# Patient Record
Sex: Male | Born: 1970
Health system: Southern US, Community
[De-identification: ages and names within clinical notes are randomized; demographics above are authoritative.]

## PROBLEM LIST (undated history)

## (undated) DIAGNOSIS — M109 Gout, unspecified: Secondary | ICD-10-CM

## (undated) DIAGNOSIS — I82409 Acute embolism and thrombosis of unspecified deep veins of unspecified lower extremity: Secondary | ICD-10-CM

## (undated) HISTORY — DX: Gout, unspecified: M10.9

## (undated) HISTORY — PX: CHOLECYSTECTOMY: SHX55

---

## 2019-07-03 ENCOUNTER — Encounter: Payer: Self-pay | Admitting: Podiatry

## 2019-07-03 ENCOUNTER — Ambulatory Visit (INDEPENDENT_AMBULATORY_CARE_PROVIDER_SITE_OTHER): Payer: Managed Care, Other (non HMO) | Admitting: Podiatry

## 2019-07-03 ENCOUNTER — Other Ambulatory Visit: Payer: Self-pay

## 2019-07-03 DIAGNOSIS — B070 Plantar wart: Secondary | ICD-10-CM

## 2019-07-07 NOTE — Progress Notes (Signed)
   Subjective: 48 y.o. male presenting today with a chief complaint of a painful lesion on the plantar aspect of the right foot that appeared about two months ago. He states it feels like he is walking on a rock. Wearing shoes increases the pain. He has been using corn pads with no significant relief. Patient is here for further evaluation and treatment.    No past medical history on file.  Objective: Physical Exam General: The patient is alert and oriented x3 in no acute distress.   Dermatology: Hyperkeratotic skin lesion(s) noted to the plantar aspect of the right foot approximately 1 cm in diameter. Pinpoint bleeding noted upon debridement. Skin is warm, dry and supple bilateral lower extremities. Negative for open lesions or macerations.   Vascular: Palpable pedal pulses bilaterally. No edema or erythema noted. Capillary refill within normal limits.   Neurological: Epicritic and protective threshold grossly intact bilaterally.    Musculoskeletal Exam: Pain on palpation to the noted skin lesion(s).  Range of motion within normal limits to all pedal and ankle joints bilateral. Muscle strength 5/5 in all groups bilateral.    Assessment: #1 plantar wart right foot #2 pain in right foot     Plan of Care:  #1 Patient was evaluated. #2 Excisional debridement of the plantar wart lesion(s) was performed using a chisel blade. Cantharone was applied and the lesion(s) was dressed with a dry sterile dressing. #3 patient is to return to clinic in 2 weeks.     Edrick Kins, DPM [facility]  Dr. Edrick Kins, [address]                                        [place], [place] [address]                Office [phone]  Fax [phone]

## 2019-07-21 ENCOUNTER — Encounter: Payer: Self-pay | Admitting: Podiatry

## 2019-07-21 ENCOUNTER — Other Ambulatory Visit: Payer: Self-pay

## 2019-07-21 ENCOUNTER — Ambulatory Visit (INDEPENDENT_AMBULATORY_CARE_PROVIDER_SITE_OTHER): Payer: Managed Care, Other (non HMO) | Admitting: Podiatry

## 2019-07-21 DIAGNOSIS — B070 Plantar wart: Secondary | ICD-10-CM

## 2019-07-27 NOTE — Progress Notes (Signed)
   Subjective: 48 y.o. male presenting today for follow up evaluation of a plantar wart of the right foot. He states the area is improving. He states the pain is less severe with walking and weightbearing. He has not done anything at home for treatment. Patient is here for further evaluation and treatment.    No past medical history on file.  Objective: Physical Exam General: The patient is alert and oriented x3 in no acute distress.   Dermatology: Hyperkeratotic skin lesion(s) noted to the plantar aspect of the right foot approximately 1 cm in diameter. Pinpoint bleeding noted upon debridement. Skin is warm, dry and supple bilateral lower extremities. Negative for open lesions or macerations.   Vascular: Palpable pedal pulses bilaterally. No edema or erythema noted. Capillary refill within normal limits.   Neurological: Epicritic and protective threshold grossly intact bilaterally.    Musculoskeletal Exam: Pain on palpation to the noted skin lesion(s).  Range of motion within normal limits to all pedal and ankle joints bilateral. Muscle strength 5/5 in all groups bilateral.    Assessment: #1 plantar wart right foot     Plan of Care:  #1 Patient was evaluated. #2 Excisional debridement of the plantar wart lesion(s) was performed using a chisel blade. Dry sterile dressing placed.  #3 Recommended OTC corn and callus remover for two weeks.  #4 patient is to return to clinic as needed.     Edrick Kins, DPM [facility]  Dr. Edrick Kins, [address]                                        [place], [place] [address]                Office [phone]  Fax [phone]

## 2020-10-04 ENCOUNTER — Ambulatory Visit: Payer: Self-pay

## 2021-04-14 LAB — HM COLONOSCOPY

## 2021-06-02 ENCOUNTER — Ambulatory Visit: Payer: Self-pay

## 2021-09-21 ENCOUNTER — Ambulatory Visit: Payer: Self-pay

## 2021-09-22 ENCOUNTER — Other Ambulatory Visit: Payer: Self-pay

## 2021-09-22 ENCOUNTER — Encounter: Payer: Self-pay | Admitting: Podiatry

## 2021-09-22 ENCOUNTER — Ambulatory Visit (INDEPENDENT_AMBULATORY_CARE_PROVIDER_SITE_OTHER): Payer: Managed Care, Other (non HMO) | Admitting: Podiatry

## 2021-09-22 ENCOUNTER — Ambulatory Visit (INDEPENDENT_AMBULATORY_CARE_PROVIDER_SITE_OTHER): Payer: Managed Care, Other (non HMO)

## 2021-09-22 DIAGNOSIS — M7661 Achilles tendinitis, right leg: Secondary | ICD-10-CM | POA: Diagnosis not present

## 2021-09-22 DIAGNOSIS — B351 Tinea unguium: Secondary | ICD-10-CM | POA: Diagnosis not present

## 2021-09-22 DIAGNOSIS — M779 Enthesopathy, unspecified: Secondary | ICD-10-CM

## 2021-09-22 MED ORDER — METHYLPREDNISOLONE 4 MG PO TBPK: ORAL_TABLET | ORAL | 0 refills | Status: DC

## 2021-09-22 MED ORDER — MELOXICAM 15 MG PO TABS: 15.0000 mg | ORAL_TABLET | Freq: Every day | ORAL | 1 refills | Status: DC

## 2021-09-22 MED ORDER — TERBINAFINE HCL 250 MG PO TABS: 250.0000 mg | ORAL_TABLET | Freq: Every day | ORAL | 0 refills | Status: DC

## 2021-09-23 LAB — HEPATIC FUNCTION PANEL
ALT: 27 IU/L (ref 0–44)
AST: 21 IU/L (ref 0–40)
Albumin: 4.5 g/dL (ref 4.0–5.0)
Alkaline Phosphatase: 79 IU/L (ref 44–121)
Bilirubin Total: 0.3 mg/dL (ref 0.0–1.2)
Bilirubin, Direct: 0.14 mg/dL (ref 0.00–0.40)
Total Protein: 6.8 g/dL (ref 6.0–8.5)

## 2021-09-29 NOTE — Progress Notes (Signed)
° °  HPI: 51 y.o. male presenting today for new complaints today regarding posterior ankle pain to the right lower extremity.  This has been ongoing for several days.  He denies a history of injury.  He experiences shooting pain.  Aggravated by going barefoot.  He has been wearing a brace which is helped.  Patient also would like to have his right great toenail evaluated.  He says that it is very discolored and thick for the past 6 months.  Sudden onset.  He has not done anything for treatment other than OTC creams which have not helped alleviate any of his symptoms for the toenail  No past medical history on file. No Known Allergies   Physical Exam: General: The patient is alert and oriented x3 in no acute distress.  Dermatology: Skin is warm, dry and supple bilateral lower extremities. Negative for open lesions or macerations.  Hyperkeratotic dystrophic discolored toenail noted to the right hallux nail plate  Vascular: Palpable pedal pulses bilaterally. Capillary refill within normal limits.  Negative for any significant edema or erythema  Neurological: Light touch and protective threshold grossly intact  Musculoskeletal Exam: No pedal deformities noted.  There is some pain on palpation to the midportion of the Achilles tendon about 5 cm proximal to the insertion of the calcaneus  Radiographic Exam:  Normal osseous mineralization. Joint spaces preserved. No fracture/dislocation/boney destruction.    Assessment: 1.  Mid substance Achilles tendinitis right 2.  Onychomycosis of toenail right hallux   Plan of Care:  1. Patient evaluated. X-Rays reviewed.  2.  Prescription for Medrol Dosepak 3.  Prescription for meloxicam 15 mg daily after completion of the Dosepak 4.  Continue ankle brace 5.  Recommend daily stretching exercises and advised against going barefoot.  Recommend good supportive shoes and sneakers that do not irritate the posterior heel or Achilles 6.  Today we discussed  different treatment options regarding onychomycosis and fungal nail infections.  Discussed both oral, topical, and laser antifungal treatment modalities.  Patient opts for oral antifungal medication.  He denies a history of liver pathology or symptoms 7.  Order placed for hepatic function panel.  Discontinue if abnormal 8.  Prescription for Lamisil to 50 mg #90 daily 9.  Return to clinic in 3 weeks  *Works at [employer], DPM [facility]  Dr. Edrick Kins, DPM    [address], [place] [address]                Office [phone]  Fax [phone]

## 2021-11-23 ENCOUNTER — Encounter: Payer: Self-pay | Admitting: Emergency Medicine

## 2021-11-23 ENCOUNTER — Emergency Department
Admission: EM | Admit: 2021-11-23 | Discharge: 2021-11-23 | Disposition: A | Discharge status: Home / Self Care | Payer: Managed Care, Other (non HMO) | Attending: Emergency Medicine | Admitting: Emergency Medicine

## 2021-11-23 ENCOUNTER — Emergency Department: Payer: Managed Care, Other (non HMO)

## 2021-11-23 ENCOUNTER — Ambulatory Visit
Admission: RE | Admit: 2021-11-23 | Discharge: 2021-11-23 | Disposition: A | Discharge status: Other Acute Inpatient Hospital | Payer: Managed Care, Other (non HMO) | Source: Ambulatory Visit | Attending: Emergency Medicine | Admitting: Emergency Medicine

## 2021-11-23 ENCOUNTER — Other Ambulatory Visit: Payer: Self-pay

## 2021-11-23 VITALS — BP 117/82 | HR 82 | Temp 98.6°F | Resp 20

## 2021-11-23 DIAGNOSIS — R9431 Abnormal electrocardiogram [ECG] [EKG]: Secondary | ICD-10-CM | POA: Diagnosis not present

## 2021-11-23 DIAGNOSIS — R079 Chest pain, unspecified: Secondary | ICD-10-CM | POA: Diagnosis present

## 2021-11-23 DIAGNOSIS — R7989 Other specified abnormal findings of blood chemistry: Secondary | ICD-10-CM | POA: Insufficient documentation

## 2021-11-23 HISTORY — DX: Acute embolism and thrombosis of unspecified deep veins of unspecified lower extremity: I82.409

## 2021-11-23 LAB — CBC
HCT: 46.1 % (ref 39.0–52.0)
Hemoglobin: 14.9 g/dL (ref 13.0–17.0)
MCH: 29.7 pg (ref 26.0–34.0)
MCHC: 32.3 g/dL (ref 30.0–36.0)
MCV: 92 fL (ref 80.0–100.0)
Platelets: 230 10*3/uL (ref 150–400)
RBC: 5.01 MIL/uL (ref 4.22–5.81)
RDW: 12.7 % (ref 11.5–15.5)
WBC: 6 10*3/uL (ref 4.0–10.5)
nRBC: 0 % (ref 0.0–0.2)

## 2021-11-23 LAB — BASIC METABOLIC PANEL
Anion gap: 6 (ref 5–15)
BUN: 17 mg/dL (ref 6–20)
CO2: 31 mmol/L (ref 22–32)
Calcium: 9.6 mg/dL (ref 8.9–10.3)
Chloride: 103 mmol/L (ref 98–111)
Creatinine, Ser: 1.36 mg/dL — ABNORMAL HIGH (ref 0.61–1.24)
GFR, Estimated: >60 mL/min (ref >60)
Glucose, Bld: 85 mg/dL (ref 70–99)
Potassium: 4.7 mmol/L (ref 3.5–5.1)
Sodium: 140 mmol/L (ref 135–145)

## 2021-11-23 LAB — TROPONIN I (HIGH SENSITIVITY)
Troponin I (High Sensitivity): 4 ng/L (ref <18)
Troponin I (High Sensitivity): 4 ng/L (ref <18)

## 2021-11-23 LAB — D-DIMER, QUANTITATIVE: D-Dimer, Quant: 0.3 ug/mL-FEU (ref 0.00–0.50)

## 2021-11-23 MED ORDER — LIDOCAINE VISCOUS HCL 2 % MT SOLN
15.0000 mL | Freq: Once | OROMUCOSAL | Status: AC
Start: 2021-11-23 — End: 2021-11-23
  Administered 2021-11-23: 15 mL via ORAL
  Filled 2021-11-23: qty 15

## 2021-11-23 MED ORDER — ALUM & MAG HYDROXIDE-SIMETH 200-200-20 MG/5ML PO SUSP
30.0000 mL | Freq: Once | ORAL | Status: AC
Start: 2021-11-23 — End: 2021-11-23
  Administered 2021-11-23: 30 mL via ORAL
  Filled 2021-11-23: qty 30

## 2021-11-23 MED ORDER — ACETAMINOPHEN 500 MG PO TABS
1000.0000 mg | ORAL_TABLET | Freq: Once | ORAL | Status: AC
  Administered 2021-11-23: 1000 mg via ORAL
  Filled 2021-11-23: qty 2

## 2021-11-23 NOTE — ED Provider Notes (Signed)
?[facility] ? ? ? ?CSN: [id number] ?Arrival date & time: 11/23/21  1305 ? ? ?  ? ?History   ?Chief Complaint ?Chief Complaint  ?Patient presents with  ? Chest Pain  ? ? ?HPI ?Roy Lewis is a 51 y.o. male.  Patient presents with midsternal and left side chest pain for approximately 48 hours which became acutely worse at 0400 this morning.  The pain woke him up this morning.  He took a Tums without relief.  He describes the pain as "tightness" and feeling like someone hit him in the chest.  No identified aggravating or alleviating factors.  The pain is constant, 5/10, nonradiating.  No associated symptoms; he denies numbness, weakness, shortness of breath, nausea, vomiting, diaphoresis, headache, dizziness, or other symptoms.  He denies history of cardiac disease. ?The history is provided by the patient.  ? ?History reviewed. No pertinent past medical history. ? ?There are no problems to display for this patient. ? ? ?History reviewed. No pertinent surgical history. ? ? ? ? ?Home Medications   ? ?Prior to Admission medications   ?Medication Sig Start Date End Date Taking? Authorizing Provider  ?meloxicam (MOBIC) 15 MG tablet Take 1 tablet (15 mg total) by mouth daily. 09/22/21   Felecia Shelling, DPM  ?methylPREDNISolone (MEDROL DOSEPAK) 4 MG TBPK tablet 6 day dose pack - take as directed 09/22/21   Felecia Shelling, DPM  ?terbinafine (LAMISIL) 250 MG tablet Take 1 tablet (250 mg total) by mouth daily. 09/22/21   Felecia Shelling, DPM  ? ? ?Family History ?History reviewed. No pertinent family history. ? ?Social History ?Social History  ? ?Tobacco Use  ? Smoking status: Never  ? Smokeless tobacco: Never  ?Substance Use Topics  ? Alcohol use: Yes  ?  Alcohol/week: 2.0 standard drinks  ?  Types: 2 Cans of beer per week  ?  Comment: 2/week  ? Drug use: Never  ? ? ? ?Allergies   ?Patient has no known allergies. ? ? ?Review of Systems ?Review of Systems  ?Respiratory:  Negative for cough and shortness of breath.    ?Cardiovascular:  Positive for chest pain. Negative for palpitations.  ?Gastrointestinal:  Negative for nausea and vomiting.  ?Neurological:  Negative for dizziness, facial asymmetry, speech difficulty, weakness, numbness and headaches.  ?All other systems reviewed and are negative. ? ? ?Physical Exam ?Triage Vital Signs ?ED Triage Vitals [11/23/21 1308]  ?Enc Vitals Group  ?   BP   ?   Pulse   ?   Resp   ?   Temp   ?   Temp src   ?   SpO2   ?   Weight   ?   Height   ?   Head Circumference   ?   Peak Flow   ?   Pain Score 5  ?   Pain Loc   ?   Pain Edu?   ?   Excl. in GC?   ? ?No data found. ? ?Updated Vital Signs ?BP 117/82   Pulse 82   Temp 98.6 ?F (37 ?C)   Resp 20   SpO2 96%  ? ?Visual Acuity ?Right Eye Distance:   ?Left Eye Distance:   ?Bilateral Distance:   ? ?Right Eye Near:   ?Left Eye Near:    ?Bilateral Near:    ? ?Physical Exam ?Vitals and nursing note reviewed.  ?Constitutional:   ?   General: He is not in acute distress. ?  Appearance: He is obese.  ?HENT:  ?   Mouth/Throat:  ?   Mouth: Mucous membranes are moist.  ?Cardiovascular:  ?   Rate and Rhythm: Normal rate and regular rhythm.  ?   Heart sounds: Normal heart sounds.  ?Pulmonary:  ?   Effort: Pulmonary effort is normal. No respiratory distress.  ?   Breath sounds: Normal breath sounds.  ?Abdominal:  ?   Palpations: Abdomen is soft.  ?   Tenderness: There is no abdominal tenderness.  ?Musculoskeletal:  ?   Cervical back: Neck supple.  ?   Right lower leg: No edema.  ?   Left lower leg: No edema.  ?Skin: ?   General: Skin is warm and dry.  ?Neurological:  ?   General: No focal deficit present.  ?   Mental Status: He is alert and oriented to person, place, and time.  ?   Sensory: No sensory deficit.  ?   Motor: No weakness.  ?   Gait: Gait normal.  ?Psychiatric:     ?   Mood and Affect: Mood normal.     ?   Behavior: Behavior normal.  ? ? ? ?UC Treatments / Results  ?Labs ?(all labs ordered are listed, but only abnormal results are  displayed) ?Labs Reviewed - No data to display ? ?EKG ? ? ?Radiology ?No results found. ? ?Procedures ?Procedures (including critical care time) ? ?Medications Ordered in UC ?Medications - No data to display ? ?Initial Impression / Assessment and Plan / UC Course  ?I have reviewed the triage vital signs and the nursing notes. ? ?Pertinent labs & imaging results that were available during my care of the patient were reviewed by me and considered in my medical decision making (see chart for details). ? ?Chest pain, abnormal EKG.  EKG shows sinus rhythm with PVCs, rate 65, no ST elevation, no previous to compare.  Discussed limitations of evaluation of chest pain in an urgent care setting.  Sending patient to the ED for evaluation.  He declines transport via EMS.  He feels he is stable to drive himself to the ED. ? ? ?Final Clinical Impressions(s) / UC Diagnoses  ? ?Final diagnoses:  ?Chest pain, unspecified type  ?Abnormal EKG  ? ? ? ?Discharge Instructions   ? ?  ?Go to the emergency department for evaluation of your chest pain and abnormal EKG. ? ? ? ? ?ED Prescriptions   ?None ?  ? ?PDMP not reviewed this encounter. ?  ?Mickie Bail, NP ?11/23/21 1335 ? ?

## 2021-11-23 NOTE — ED Triage Notes (Signed)
Pt comes into the ED via POV c/o left side chest pain that started 2 days ago.  Pt states it got worse last night to the point where it woke him up in his sleep.  Pt denies any cardiac history.  Pt went to UC and they found his EKG to be abnormal and sent him here.  Pt denies any radiating pain, but admits to some [facility] with the pain.  ?

## 2021-11-23 NOTE — Discharge Instructions (Addendum)
Follow-up with cardiology tomorrow at 9 AM and return to the ER if develop worsening pain or any other issues.  You can take 1 g every 8 hours to help with any pain. ? ? ? ? ?

## 2021-11-23 NOTE — Discharge Instructions (Addendum)
Go to the emergency department for evaluation of your chest pain and abnormal EKG.  ? ? ?

## 2021-11-23 NOTE — ED Provider Notes (Signed)
? ?[facility] ?Provider Note ? ? ? Event Date/Time  ? First MD Initiated Contact with Patient 11/23/21 1532   ?  (approximate) ? ? ?History  ? ?Chest Pain ? ? ?HPI ? ?Roy Lewis is a 51 y.o. male who comes in from urgent care for chest pain.  Patient had left-sided chest pain for approximately 48 hours which came acutely worse at 4 AM this morning he took Tums without any relief.  Patient went over to urgent care and was noted to have an EKG with PVCs and was told to go to the ER to be evaluated.  Patient denies any prior cardiac history.  He denies smoking.  He denies been on any medications currently.  He states that the only thing that he has a history of is a blood clot in his right leg that was 4 years ago in the setting of a long flight.  He denies any significant shortness of breath.  He reports that the pain is just about 2 inches on his left chest wall but is like a pressure sensation.  He reports it is currently very minimal.  He reports that he was not sure if it could just be his acid reflux given he does have a history of that.  But he does not feel like it is a burning sensation.  He denies any chest swelling, abdominal pain or other concerns.  He denies any exertional component to his chest pain. ? ?I reviewed patient's podiatry visit from 08/2021 ? ? ? ?Physical Exam  ? ?Triage Vital Signs: ?ED Triage Vitals  ?Enc Vitals Group  ?   BP 11/23/21 1357 115/79  ?   Pulse Rate 11/23/21 1357 81  ?   Resp 11/23/21 1357 16  ?   Temp 11/23/21 1357 97.8 ?F (36.6 ?C)  ?   Temp Source 11/23/21 1357 Oral  ?   SpO2 11/23/21 1357 92 %  ?   Weight 11/23/21 1343 262 lb (118.8 kg)  ?   Height 11/23/21 1343 5\' 10"  (1.778 m)  ?   Head Circumference --   ?   Peak Flow --   ?   Pain Score 11/23/21 1343 7  ?   Pain Loc --   ?   Pain Edu? --   ?   Excl. in GC? --   ? ? ?Most recent vital signs: ?Vitals:  ? 11/23/21 1357  ?BP: 115/79  ?Pulse: 81  ?Resp: 16  ?Temp: 97.8 ?F (36.6 ?C)  ?SpO2: 92%   ? ? ? ?General: Awake, no distress.  ?CV:  Good peripheral perfusion.  No chest wall tenderness.  No rash noted ?Resp:  Normal effort.  ?Abd:  No distention.  Soft and nontender ?Other:  No leg swelling.  No calf tenderness ? ? ?ED Results / Procedures / Treatments  ? ?Labs ?(all labs ordered are listed, but only abnormal results are displayed) ?Labs Reviewed  ?BASIC METABOLIC PANEL - Abnormal; Notable for the following components:  ?    Result Value  ? Creatinine, Ser 1.36 (*)   ? All other components within normal limits  ?CBC  ?TROPONIN I (HIGH SENSITIVITY)  ?TROPONIN I (HIGH SENSITIVITY)  ? ? ? ?EKG ? ?My interpretation of EKG: ? ?Normal sinus rate of 68 without any ST elevation, T wave inversion in lead II with an occasional PVC, normal intervals ? ?Repeat EKG is normal sinus rate of 65 with a PVC, no ST elevation, T wave inversion in  V2 and normal intervals ? ?Repeat EKG is normal sinus rate of 83 without any ST elevation, T wave inversion in V2, normal intervals ? ?RADIOLOGY ?I have reviewed the xray personally and no evidence of pneumonia, pleural effusions ? ? ?PROCEDURES: ? ?Critical Care performed: No ? ?Procedures ? ? ?MEDICATIONS ORDERED IN ED: ?Medications  ?acetaminophen (TYLENOL) tablet 1,000 mg (1,000 mg Oral Given 11/23/21 1615)  ?alum & mag hydroxide-simeth (MAALOX/MYLANTA) 200-200-20 MG/5ML suspension 30 mL (30 mLs Oral Given 11/23/21 1615)  ?  And  ?lidocaine (XYLOCAINE) 2 % viscous mouth solution 15 mL (15 mLs Oral Given 11/23/21 1615)  ? ? ? ?IMPRESSION / MDM / ASSESSMENT AND PLAN / ED COURSE  ?I reviewed the triage vital signs and the nursing notes. ? ?Patient comes in with new chest pain.  Chest pain sounds of kind of atypical but will get EKG, cardiac markers to further evaluate.  No abdominal pain/gallbladder pathology and he reports already having it removed.  Given patient's history of blood clot will add on D-dimer to evaluate for PE.  His original oxygen was charted at 92% but in urgent  care it was 95% when I hooked him up it was 98% so not sure if this was just an air.  We will keep a close eye on this as well.  He denies any significant shortness of breath. ? ?BMP shows creatinine elevated at 1.36. ?CBC shows no anemia.  No white count elevation ?Troponin is negative ? ?Score was 1.5 due to prior history of DVT therefore proceeded with D-dimer which was negative therefore low suspicion for PE. ? ?On repeat evaluation patient reports near resolution of symptoms.  D-dimer is negative, troponin is negative ? ?I considered admission for patient but given patient's low heart score and cardiac markers negative x2 I think it is reasonable to follow-up outpatient with cardiology ? ?Discussed with cardiology to get close follow-up and he will be seen tomorrow at 9 AM.  Patient feels comfortable going home at this time and understands that if symptoms are worsening to return to the ER immediately because I cannot protect the future heart attacks.  He expressed understanding and felt comfortable with home ? ?  ? ? ?FINAL CLINICAL IMPRESSION(S) / ED DIAGNOSES  ? ?Final diagnoses:  ?Chest pain, unspecified type  ? ? ? ?Rx / DC Orders  ? ?ED Discharge Orders   ? ? None  ? ?  ? ? ? ?Note:  This document was prepared using Dragon voice recognition software and may include unintentional dictation errors. ?  ?Concha Se, MD ?11/23/21 1720 ? ?

## 2021-11-23 NOTE — ED Triage Notes (Signed)
Pt here with left sided chest tightness since yesterday with an increase in discomfort since 4am this morning.  ?

## 2022-07-19 ENCOUNTER — Ambulatory Visit (INDEPENDENT_AMBULATORY_CARE_PROVIDER_SITE_OTHER): Payer: Managed Care, Other (non HMO)

## 2022-07-19 ENCOUNTER — Ambulatory Visit
Admission: EM | Admit: 2022-07-19 | Discharge: 2022-07-19 | Disposition: A | Discharge status: Home / Self Care | Payer: Managed Care, Other (non HMO) | Attending: Emergency Medicine | Admitting: Emergency Medicine

## 2022-07-19 DIAGNOSIS — M25572 Pain in left ankle and joints of left foot: Secondary | ICD-10-CM | POA: Diagnosis not present

## 2022-07-19 MED ORDER — PREDNISONE 10 MG (21) PO TBPK: ORAL_TABLET | Freq: Every day | ORAL | 0 refills | Status: DC

## 2022-07-19 NOTE — Discharge Instructions (Addendum)
Take the prednisone as directed.    Follow up with your primary care provider or an orthopedist if your symptoms are not improving.

## 2022-07-19 NOTE — ED Provider Notes (Signed)
[facility]    CSN: [id number] Arrival date & time: 07/19/22  1315      History   Chief Complaint Chief Complaint  Patient presents with   Foot Pain    Entered by patient    HPI Roy Lewis is a 51 y.o. male.  Patient presents with pain in his left posterior ankle; worse x 2 days.  No falls or injury.  The pain is worse with weightbearing and ambulation; It improves with rest.  He denies numbness, weakness, paresthesias, redness, bruising, wounds, fever, chills, or other symptoms.  His medical history includes DVT.  The history is provided by the patient and medical records.    Past Medical History:  Diagnosis Date   DVT (deep venous thrombosis) (HCC)     There are no problems to display for this patient.   Past Surgical History:  Procedure Laterality Date   CHOLECYSTECTOMY         Home Medications    Prior to Admission medications   Medication Sig Start Date End Date Taking? Authorizing Provider  predniSONE (STERAPRED UNI-PAK 21 TAB) 10 MG (21) TBPK tablet Take by mouth daily. As directed 07/19/22  Yes Mickie Bail, NP  meloxicam (MOBIC) 15 MG tablet Take 1 tablet (15 mg total) by mouth daily. 09/22/21   Felecia Shelling, DPM  terbinafine (LAMISIL) 250 MG tablet Take 1 tablet (250 mg total) by mouth daily. 09/22/21   Felecia Shelling, DPM    Family History History reviewed. No pertinent family history.  Social History Social History   Tobacco Use   Smoking status: Never   Smokeless tobacco: Never  Substance Use Topics   Alcohol use: Yes    Alcohol/week: 2.0 standard drinks of alcohol    Types: 2 Cans of beer per week    Comment: 2/week   Drug use: Never     Allergies   Patient has no known allergies.   Review of Systems Review of Systems  Constitutional:  Negative for chills and fever.  Respiratory:  Negative for cough and shortness of breath.   Cardiovascular:  Negative for chest pain and palpitations.  Musculoskeletal:  Positive  for arthralgias. Negative for gait problem, joint swelling and myalgias.  Skin:  Negative for color change, rash and wound.  Neurological:  Negative for weakness and numbness.  All other systems reviewed and are negative.    Physical Exam Triage Vital Signs ED Triage Vitals [07/19/22 1326]  Enc Vitals Group     BP      Pulse Rate 84     Resp 18     Temp 97.6 F (36.4 C)     Temp src      SpO2 98 %     Weight      Height      Head Circumference      Peak Flow      Pain Score      Pain Loc      Pain Edu?      Excl. in GC?    No data found.  Updated Vital Signs BP 118/76   Pulse 84   Temp 97.6 F (36.4 C)   Resp 18   SpO2 98%   Visual Acuity Right Eye Distance:   Left Eye Distance:   Bilateral Distance:    Right Eye Near:   Left Eye Near:    Bilateral Near:     Physical Exam Vitals and nursing note reviewed.  Constitutional:  General: He is not in acute distress.    Appearance: He is well-developed. He is not ill-appearing.  HENT:     Mouth/Throat:     Mouth: Mucous membranes are moist.  Cardiovascular:     Rate and Rhythm: Normal rate and regular rhythm.     Heart sounds: Normal heart sounds.  Pulmonary:     Effort: Pulmonary effort is normal. No respiratory distress.     Breath sounds: Normal breath sounds.  Musculoskeletal:        General: Tenderness present. No swelling or deformity. Normal range of motion.     Cervical back: Neck supple.       Legs:  Skin:    General: Skin is warm and dry.     Capillary Refill: Capillary refill takes less than 2 seconds.     Findings: No bruising, erythema, lesion or rash.  Neurological:     General: No focal deficit present.     Mental Status: He is alert and oriented to person, place, and time.     Sensory: No sensory deficit.     Motor: No weakness.     Gait: Gait normal.  Psychiatric:        Mood and Affect: Mood normal.        Behavior: Behavior normal.      UC Treatments / Results   Labs (all labs ordered are listed, but only abnormal results are displayed) Labs Reviewed - No data to display  EKG   Radiology DG Ankle Complete Left  Result Date: 07/19/2022 CLINICAL DATA:  pain.  no injury EXAM: LEFT ANKLE COMPLETE - 3+ VIEW COMPARISON:  None Available. FINDINGS: There is no evidence of acute fracture. Alignment is normal. Plantar and dorsal calcaneal spurring. No significant hindfoot or midfoot arthritis. IMPRESSION: No evidence of acute fracture. Electronically Signed   By: Caprice Renshaw M.D.   On: 07/19/2022 14:15    Procedures Procedures (including critical care time)  Medications Ordered in UC Medications - No data to display  Initial Impression / Assessment and Plan / UC Course  I have reviewed the triage vital signs and the nursing notes.  Pertinent labs & imaging results that were available during my care of the patient were reviewed by me and considered in my medical decision making (see chart for details).    Acute left ankle pain.  Xray negative.  Treating with prednisone taper.  Discussed rest, elevation, ice packs.  Instructed patient to follow up with his PCP or an orthopedist if his symptoms are not improving.  He agrees to plan of care.    Final Clinical Impressions(s) / UC Diagnoses   Final diagnoses:  Acute left ankle pain     Discharge Instructions      Take the prednisone as directed.    Follow up with your primary care provider or an orthopedist if your symptoms are not improving.        ED Prescriptions     Medication Sig Dispense Auth. Provider   predniSONE (STERAPRED UNI-PAK 21 TAB) 10 MG (21) TBPK tablet Take by mouth daily. As directed 21 tablet Mickie Bail, NP      PDMP not reviewed this encounter.   Mickie Bail, NP 07/19/22 1426

## 2022-07-19 NOTE — ED Triage Notes (Signed)
Patient to Urgent Care with complaints of left sided ankle pain x2 days. Denies any known injury.   Describes soreness in the back of his heel. Pain worse when standing for long periods of time/ walking.   Has been applying ice.

## 2022-08-07 ENCOUNTER — Ambulatory Visit: Payer: Managed Care, Other (non HMO) | Admitting: Podiatry

## 2022-12-17 ENCOUNTER — Inpatient Hospital Stay: Admission: RE | Admit: 2022-12-17 | Payer: Managed Care, Other (non HMO) | Source: Ambulatory Visit

## 2023-04-11 ENCOUNTER — Ambulatory Visit (INDEPENDENT_AMBULATORY_CARE_PROVIDER_SITE_OTHER): Payer: Managed Care, Other (non HMO) | Admitting: Podiatry

## 2023-04-11 ENCOUNTER — Encounter: Payer: Self-pay | Admitting: Podiatry

## 2023-04-11 DIAGNOSIS — M205X2 Other deformities of toe(s) (acquired), left foot: Secondary | ICD-10-CM

## 2023-04-11 DIAGNOSIS — M7752 Other enthesopathy of left foot: Secondary | ICD-10-CM | POA: Diagnosis not present

## 2023-04-11 NOTE — Progress Notes (Signed)
  Subjective:  Patient ID: Roy Lewis, male    DOB: 1970/10/04,  MRN: [id number]  Chief Complaint  Patient presents with   Toe Pain    Left foot pain under the left big toe     52 y.o. male presents with the above complaint.  Patient presents with complaint of left hallux limitus with underlying painful capsulitis.  Patient states is painful to touch has progressive gotten worse it got swollen.  It is doing much better now.  Denies any other acute complaints pain scale 7 out of 10 dull achy in nature hurts with ambulation worse with pressure it is getting a little bit better after he was put on steroid pack by [facility].   Review of Systems: Negative except as noted in the HPI. Denies N/V/F/Ch.  Past Medical History:  Diagnosis Date   DVT (deep venous thrombosis) (HCC)     Current Outpatient Medications:    methylPREDNISolone (MEDROL DOSEPAK) 4 MG TBPK tablet, TAKE 6 TABLETS ON DAY 1 AS DIRECTED ON PACKAGE AND DECREASE BY 1 TAB EACH DAY FOR A TOTAL OF 6 DAYS, Disp: , Rfl:    Na Sulfate-K Sulfate-Mg Sulf 17.5-3.13-1.6 GM/177ML SOLN, Take by mouth., Disp: , Rfl:    meloxicam (MOBIC) 15 MG tablet, Take 1 tablet (15 mg total) by mouth daily., Disp: 30 tablet, Rfl: 1   predniSONE (STERAPRED UNI-PAK 21 TAB) 10 MG (21) TBPK tablet, Take by mouth daily. As directed, Disp: 21 tablet, Rfl: 0   terbinafine (LAMISIL) 250 MG tablet, Take 1 tablet (250 mg total) by mouth daily., Disp: 90 tablet, Rfl: 0  Social History   Tobacco Use  Smoking Status Never  Smokeless Tobacco Never    No Known Allergies Objective:  There were no vitals filed for this visit. There is no height or weight on file to calculate BMI. Constitutional Well developed. Well nourished.  Vascular Dorsalis pedis pulses palpable bilaterally. Posterior tibial pulses palpable bilaterally. Capillary refill normal to all digits.  No cyanosis or clubbing noted. Pedal hair growth normal.  Neurologic Normal speech. Oriented  to person, place, and time. Epicritic sensation to light touch grossly present bilaterally.  Dermatologic Nails well groomed and normal in appearance. No open wounds. No skin lesions.  Orthopedic: Pain on palpation to the end range of motion left first metatarsophalangeal joint.  No deep intra-articular pain noted no crepitus clinically appreciated.  Pain with dorsiflexion of the big toe joint no pain with plantarflexion of the big toe joint.  No pain at the sesamoidal complex   Radiographs: None Assessment:   1. Hallux limitus, left   2. Capsulitis of metatarsophalangeal (MTP) joint of left foot    Plan:  Patient was evaluated and treated and all questions answered.  Left hallux limitus with underlying capsulitis -All questions and concerns were discussed with the patient extensive detail given the implant pain that he is having he will benefit from steroid injection to help decrease acute inflammatory component associate with pain.  Patient agrees with the plan and would like to proceed with steroid injection. -A steroid injection was performed at left first MTP using 1% plain Lidocaine and 10 mg of Kenalog. This was well tolerated. -Also discussed shoe gear modification -He will also benefit from orthotics during next visit we will discuss her further   No follow-ups on file.

## 2023-05-09 ENCOUNTER — Ambulatory Visit: Payer: Managed Care, Other (non HMO) | Admitting: Podiatry

## 2023-11-20 ENCOUNTER — Encounter: Payer: Self-pay | Admitting: Podiatrist

## 2023-11-20 ENCOUNTER — Ambulatory Visit: Admitting: Podiatrist

## 2023-11-20 DIAGNOSIS — M10472 Other secondary gout, left ankle and foot: Secondary | ICD-10-CM | POA: Diagnosis not present

## 2023-11-20 DIAGNOSIS — M79672 Pain in left foot: Secondary | ICD-10-CM

## 2023-11-20 MED ORDER — COLCHICINE 0.6 MG PO TABS: 0.6000 mg | ORAL_TABLET | Freq: Every day | ORAL | 2 refills | Status: DC

## 2023-11-20 NOTE — Patient Instructions (Signed)

## 2023-11-20 NOTE — Progress Notes (Signed)
 Chief Complaint  Patient presents with   Foot Pain    Follow up capsulitis/hallux limitus left - patient has been having gout flare, better now, [place] really [place] last year, didn't get custom orthotics, but does wear OTC insoles     HPI: Patient is 53 y.o. male who presents today for gout pain left foot.  Relates he has had about 5 gout flares in the last 3 years and he tries to watch his diet.  Relates he notices he gets a gout flare after eating steak or scallops or red wine.  He is not on any medications for gout at this time.   No Known Allergies  Review of systems is negative except as noted in the HPI.  Denies nausea/ vomiting/ fevers/ chills or night sweats.   Denies difficulty breathing, denies calf pain or tenderness  Physical Exam  Patient is awake, alert, and oriented x 3.  In no acute distress.    Vascular status is intact with palpable pedal pulses DP and PT bilateral and capillary refill time less than 3 seconds bilateral.  No edema or erythema noted.   Neurological exam reveals epicritic and protective sensation grossly intact bilateral.   Dermatological exam reveals skin is supple and dry to bilateral feet.  No open lesions present.    Musculoskeletal exam: Pain first metatarsophalangeal joint with pain and swelling left.  Slight calor noted consistent with gout flare.   Assessment:   ICD-10-CM   1. Other secondary acute gout of left foot  M10.472        Plan: Discussed exam and treatment recommendations with the patient.  He states that the last injection he received by Dr. Lydia Sams was very helpful and if possible he would like another one at today's visit.  I did agree apart the skin with alcohol infiltrated 10 mg of Kenalog with 0.5% Marcaine plain into the area of maximal tenderness lateral to the first metatarsal head.  He tolerated this well.  Prescription for colchicine  also written for him to use in times of acute  gout attacks discussed its use.  Recommended he speak to his primary care physician regarding long-term medications to treat and prevent future gout attacks.  He will be seen back as needed in the future.

## 2023-11-22 ENCOUNTER — Ambulatory Visit: Admitting: Podiatrist

## 2023-11-22 IMAGING — CR DG CHEST 2V
2 series · 2 of 2 positions shown · non-contrast
Comparison: None.

CLINICAL DATA: Left-sided chest pain started 2 days ago.

EXAM:
CHEST - 2 VIEW

[chest pa]
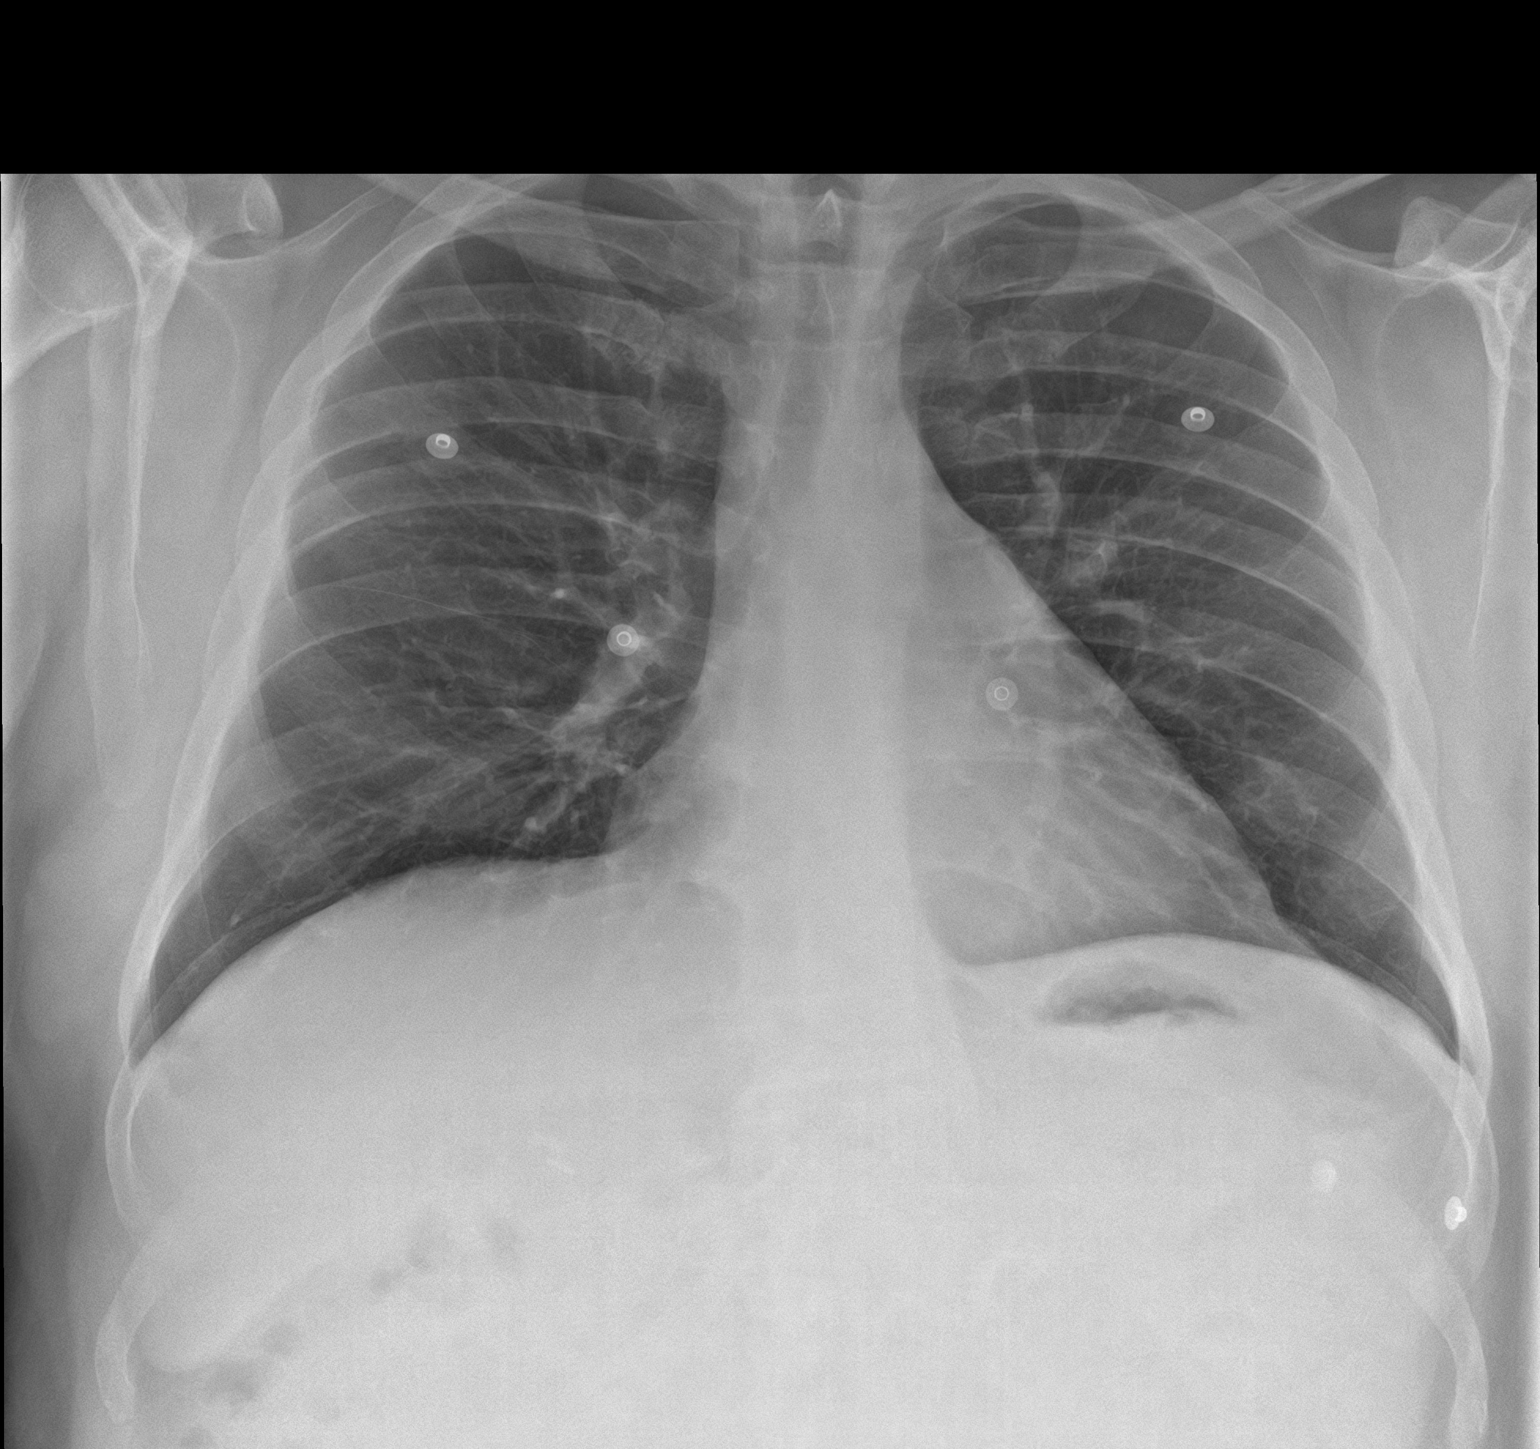

[chest lat]
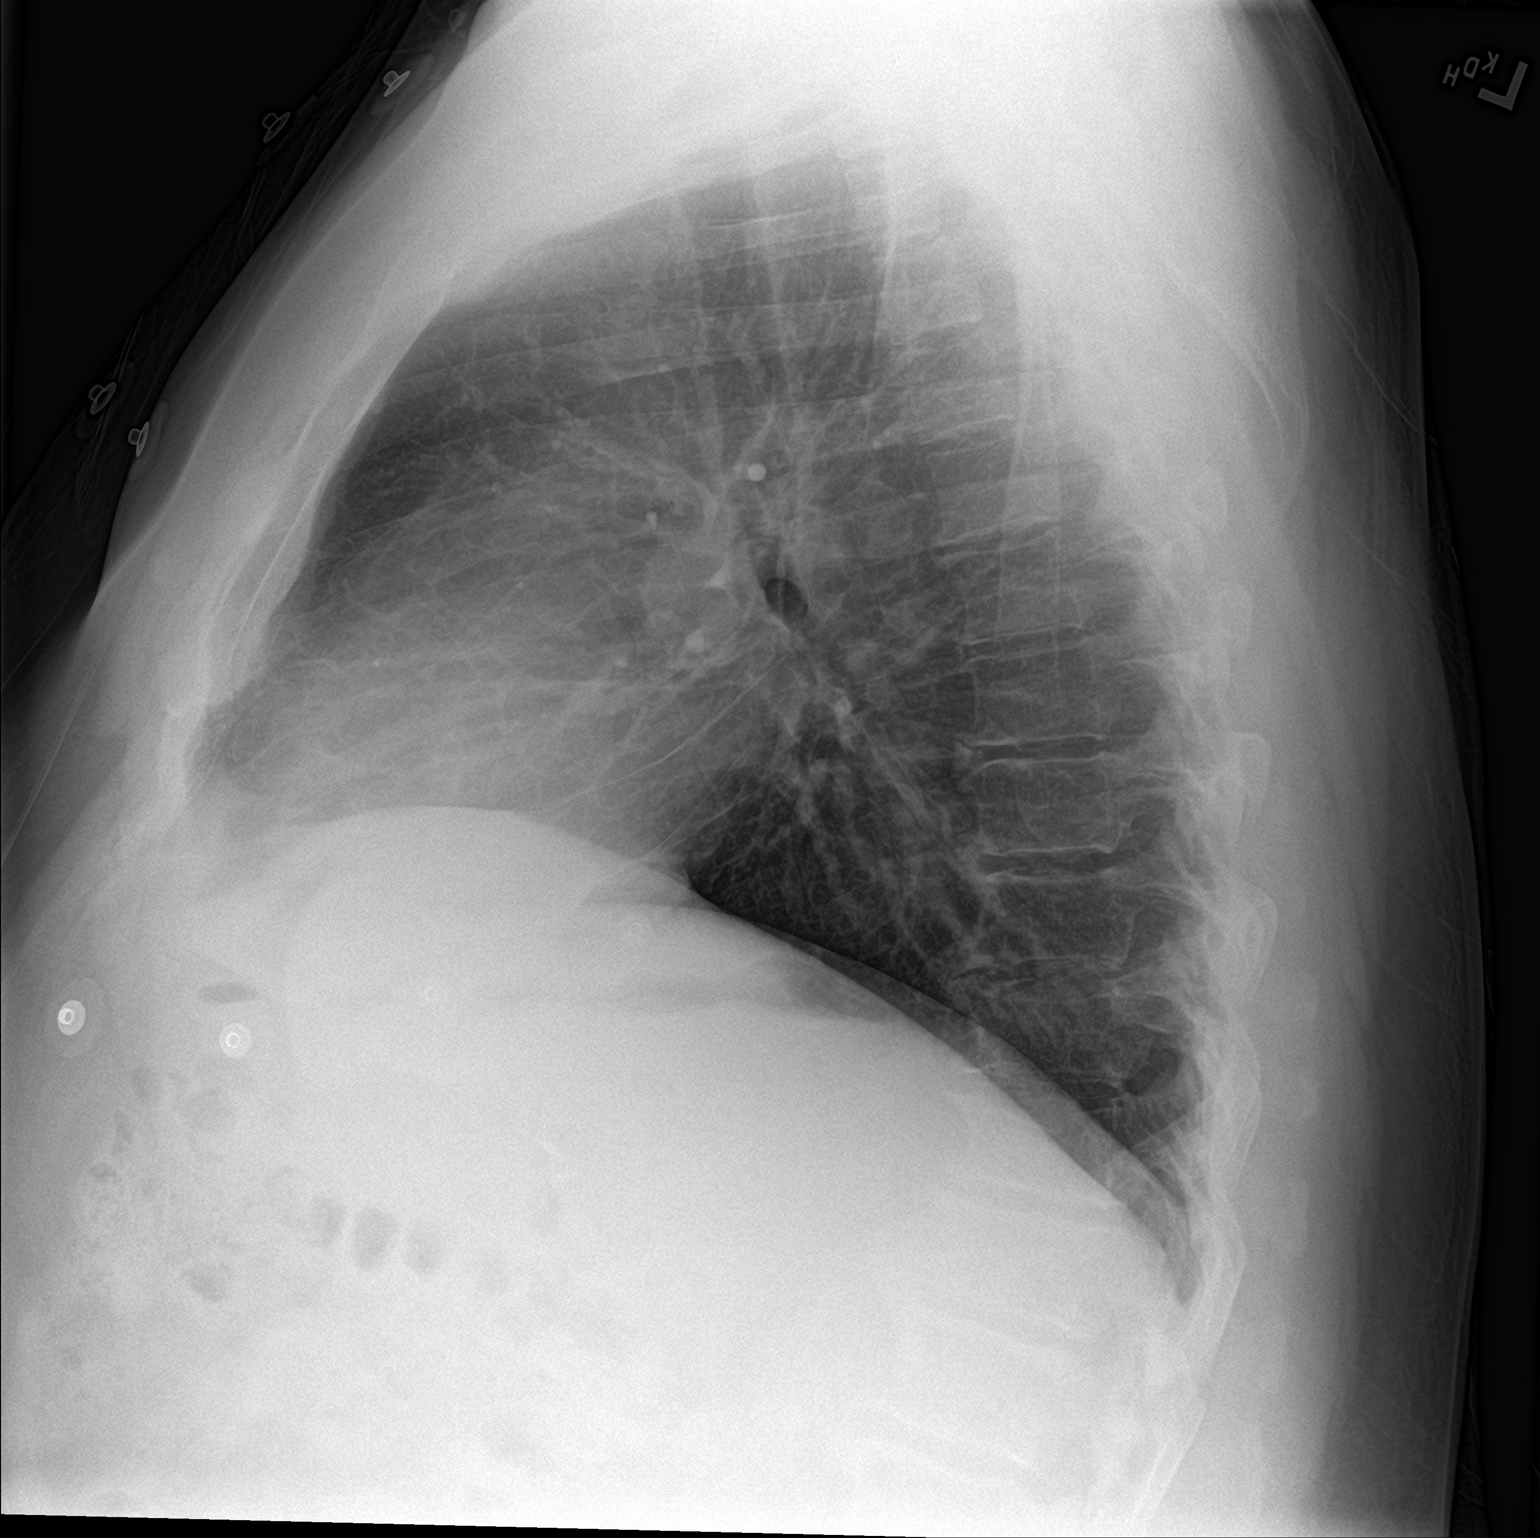

[2 of 2 positions shown; findings below may reference images not displayed]

FINDINGS: The heart size and mediastinal contours are within normal limits.
Both lungs are clear. The visualized skeletal structures are
unremarkable.
IMPRESSION: No active cardiopulmonary disease.

## 2024-02-03 ENCOUNTER — Ambulatory Visit: Admitting: Physician Assistant

## 2024-02-12 DIAGNOSIS — S62329A Displaced fracture of shaft of unspecified metacarpal bone, initial encounter for closed fracture: Secondary | ICD-10-CM | POA: Insufficient documentation

## 2024-02-13 ENCOUNTER — Ambulatory Visit: Admitting: Physician Assistant

## 2024-02-13 VITALS — BP 102/78 | HR 93 | Temp 97.7°F | Ht 70.0 in | Wt 272.0 lb

## 2024-02-13 DIAGNOSIS — E66812 Obesity, class 2: Secondary | ICD-10-CM | POA: Diagnosis not present

## 2024-02-13 DIAGNOSIS — E785 Hyperlipidemia, unspecified: Secondary | ICD-10-CM | POA: Diagnosis not present

## 2024-02-13 DIAGNOSIS — Z125 Encounter for screening for malignant neoplasm of prostate: Secondary | ICD-10-CM

## 2024-02-13 DIAGNOSIS — M109 Gout, unspecified: Secondary | ICD-10-CM | POA: Insufficient documentation

## 2024-02-13 DIAGNOSIS — Z2821 Immunization not carried out because of patient refusal: Secondary | ICD-10-CM

## 2024-02-13 DIAGNOSIS — Z0000 Encounter for general adult medical examination without abnormal findings: Secondary | ICD-10-CM

## 2024-02-13 DIAGNOSIS — M1A0720 Idiopathic chronic gout, left ankle and foot, without tophus (tophi): Secondary | ICD-10-CM | POA: Diagnosis not present

## 2024-02-13 DIAGNOSIS — B351 Tinea unguium: Secondary | ICD-10-CM | POA: Insufficient documentation

## 2024-02-13 NOTE — Patient Instructions (Signed)

## 2024-02-13 NOTE — Progress Notes (Signed)
 Date:  02/13/2024   Name:  Nero Sawatzky   DOB:  1971/05/09   MRN:  [id number]   Chief Complaint: Establish Care  HPI Garnette is a very pleasant 53 year old male with a history of gout and obesity who presents to the clinic today to establish care and complete a routine physical.  Since his last gout flare, he has made significant improvements in his diet specifically reducing consumption of processed sugars, alcohol, and red meats.  He has no particular concerns to address at today's visit.  Last Physical: 4y ago Last Dental Exam: Mar 2025 Last Eye Exam: 2y ago Last CRC screen: 2022 normal Last PSA: Normal Immunizations Due: Tdap, Shingrix    Medication list has been reviewed and updated.  Current Meds  Medication Sig   colchicine  0.6 MG tablet Take 1 tablet (0.6 mg total) by mouth daily. Take as needed for gout attack     Review of Systems  Patient Active Problem List   Diagnosis Date Noted   Gout 02/13/2024   Mild hyperlipidemia 02/13/2024   Class 2 obesity 02/13/2024   Onychomycosis of great toe 02/13/2024    No Known Allergies   There is no immunization history on file for this patient.  Past Surgical History:  Procedure Laterality Date   CHOLECYSTECTOMY      Social History   Tobacco Use   Smoking status: Never   Smokeless tobacco: Never  Vaping Use   Vaping status: Never Used  Substance Use Topics   Alcohol use: Yes    Alcohol/week: 1.0 standard drink of alcohol    Types: 1 Shots of liquor per week    Comment: 1/week   Drug use: Never    Family History  Problem Relation Age of Onset   Lung cancer Mother    Lung cancer Father         02/13/2024    2:05 PM  GAD 7 : Generalized Anxiety Score  Nervous, Anxious, on Edge 0  Control/stop worrying 0  Worry too much - different things 0  Trouble relaxing 0  Restless 0  Easily annoyed or irritable 0  Afraid - awful might happen 0  Total GAD 7 Score 0  Anxiety Difficulty Not difficult at  all       02/13/2024    2:05 PM  Depression screen PHQ 2/9  Decreased Interest 0  Down, Depressed, Hopeless 0  PHQ - 2 Score 0    BP Readings from Last 3 Encounters:  02/13/24 102/78  07/19/22 118/76  11/23/21 (!) 126/94    Wt Readings from Last 3 Encounters:  02/13/24 272 lb (123.4 kg)  11/23/21 262 lb (118.8 kg)    BP 102/78   Pulse 93   Temp 97.7 F (36.5 C)   Ht 5' 10 (1.778 m)   Wt 272 lb (123.4 kg)   SpO2 97%   BMI 39.03 kg/m   Physical Exam Vitals and nursing note reviewed.  Constitutional:      Appearance: Normal appearance. He is obese.  HENT:     Ears:     Comments: EAC clear bilaterally with good view of TM which is without effusion or erythema.     Nose: Nose normal.     Mouth/Throat:     Mouth: Mucous membranes are moist. No oral lesions.     Dentition: Normal dentition.     Pharynx: No posterior oropharyngeal erythema.  Eyes:     Extraocular Movements: Extraocular movements intact.  Conjunctiva/sclera: Conjunctivae normal.     Pupils: Pupils are equal, round, and reactive to light.  Neck:     Thyroid: No thyromegaly.  Cardiovascular:     Rate and Rhythm: Normal rate and regular rhythm.     Heart sounds: No murmur heard.    No friction rub. No gallop.     Comments: Pulses 2+ at radial, PT, DP bilaterally. No carotid bruit. No peripheral edema Pulmonary:     Effort: Pulmonary effort is normal.     Breath sounds: Normal breath sounds.  Abdominal:     General: Bowel sounds are normal.     Palpations: Abdomen is soft. There is no mass.     Tenderness: There is no abdominal tenderness.  Genitourinary:    Comments: Genital/rectal exam deferred. Reviewed technique for testicular self-exam. Musculoskeletal:     Comments: Full ROM with strength 5/5 bilateral upper and lower extremities  Feet:     Comments: Early onychomycosis of the medial half of the right great toenail Lymphadenopathy:     Cervical: No cervical adenopathy.  Skin:     General: Skin is warm.     Capillary Refill: Capillary refill takes less than 2 seconds.     Findings: No lesion or rash.  Neurological:     Mental Status: He is alert and oriented to person, place, and time.     Gait: Gait is intact.  Psychiatric:        Mood and Affect: Mood normal.        Behavior: Behavior normal.     Recent Labs     Component Value Date/Time   NA 140 11/23/2021 1400   K 4.7 11/23/2021 1400   CL 103 11/23/2021 1400   CO2 31 11/23/2021 1400   GLUCOSE 85 11/23/2021 1400   BUN 17 11/23/2021 1400   CREATININE 1.36 (H) 11/23/2021 1400   CALCIUM 9.6 11/23/2021 1400   PROT 6.8 09/22/2021 1320   ALBUMIN 4.5 09/22/2021 1320   AST 21 09/22/2021 1320   ALT 27 09/22/2021 1320   ALKPHOS 79 09/22/2021 1320   BILITOT 0.3 09/22/2021 1320   GFRNONAA >60 11/23/2021 1400    Lab Results  Component Value Date   WBC 6.0 11/23/2021   HGB 14.9 11/23/2021   HCT 46.1 11/23/2021   MCV 92.0 11/23/2021   PLT 230 11/23/2021   No results found for: HGBA1C No results found for: CHOL, HDL, LDLCALC, LDLDIRECT, TRIG, CHOLHDL No results found for: TSH   Assessment and Plan:  1. Annual physical exam (Primary) Encouraged healthy lifestyle including regular physical activity and consumption of whole fruits and vegetables. Encouraged routine dental and eye exams.   - PSA - Lipid panel - CBC with Differential/Platelet - Comprehensive metabolic panel with GFR - TSH  2. Idiopathic chronic gout of left foot without tophus Not active at present.  Discussed potential option for preventative therapy using allopurinol but patient would like to continue to manage with diet for now which I think is reasonable.  Patient education attached.  Will check uric acid level along with his routine labs. - Uric acid  3. Mild hyperlipidemia Check nonfasting lipids today - Lipid panel  4. Class 2 obesity Check routine labs for metabolic comorbidities.  Encouraged continued  healthy lifestyle change. - Lipid panel - CBC with Differential/Platelet - Comprehensive metabolic panel with GFR - TSH  5. Onychomycosis of great toe Discussed with patient topicals typically not effective for onychomycosis.  Option for 64-month course of oral terbinafine ,  but patient defers treatment at this time.  6. Screening PSA (prostate specific antigen) Prostate exam deferred, will check PSA - PSA  7. Immunization deferred Due for Tdap and Shingrix, but patient defers both today.  Will consider for future visit.  Also advised he can get these at any local pharmacy.    Return in about 1 year (around 02/12/2025) for CPE.    Rolan Hoyle, PA-C, DMSc, Nutritionist [facility] [phone]

## 2024-02-14 ENCOUNTER — Ambulatory Visit: Payer: Self-pay | Admitting: Physician Assistant

## 2024-02-14 LAB — COMPREHENSIVE METABOLIC PANEL WITH GFR
ALT: 19 IU/L (ref 0–44)
AST: 16 IU/L (ref 0–40)
Albumin: 4.6 g/dL (ref 4.1–5.1)
Alkaline Phosphatase: 81 IU/L (ref 44–121)
BUN/Creatinine Ratio: 19 (ref 9–20)
BUN: 21 mg/dL (ref 6–24)
Bilirubin Total: 0.3 mg/dL (ref 0.0–1.2)
CO2: 23 mmol/L (ref 20–29)
Calcium: 9.9 mg/dL (ref 8.7–10.2)
Chloride: 101 mmol/L (ref 96–106)
Creatinine, Ser: 1.11 mg/dL (ref 0.76–1.27)
Globulin, Total: 2.4 g/dL (ref 1.5–4.5)
Glucose: 88 mg/dL (ref 70–99)
Potassium: 4.2 mmol/L (ref 3.5–5.2)
Sodium: 139 mmol/L (ref 134–144)
Total Protein: 7 g/dL (ref 6.0–8.5)
eGFR: 81 mL/min/1.73 (ref >59)

## 2024-02-14 LAB — LIPID PANEL
Chol/HDL Ratio: 4.2 ratio (ref 0.0–5.0)
Cholesterol, Total: 212 mg/dL — ABNORMAL HIGH (ref 100–199)
HDL: 50 mg/dL (ref >39)
LDL Chol Calc (NIH): 126 mg/dL — ABNORMAL HIGH (ref 0–99)
Triglycerides: 204 mg/dL — ABNORMAL HIGH (ref 0–149)
VLDL Cholesterol Cal: 36 mg/dL (ref 5–40)

## 2024-02-14 LAB — CBC WITH DIFFERENTIAL/PLATELET
Basophils Absolute: 0.1 x10E3/uL (ref 0.0–0.2)
Basos: 1 %
EOS (ABSOLUTE): 0.2 x10E3/uL (ref 0.0–0.4)
Eos: 2 %
Hematocrit: 48.6 % (ref 37.5–51.0)
Hemoglobin: 15.6 g/dL (ref 13.0–17.7)
Immature Grans (Abs): 0 x10E3/uL (ref 0.0–0.1)
Immature Granulocytes: 0 %
Lymphocytes Absolute: 1.8 x10E3/uL (ref 0.7–3.1)
Lymphs: 26 %
MCH: 29.7 pg (ref 26.6–33.0)
MCHC: 32.1 g/dL (ref 31.5–35.7)
MCV: 92 fL (ref 79–97)
Monocytes Absolute: 0.6 x10E3/uL (ref 0.1–0.9)
Monocytes: 8 %
Neutrophils Absolute: 4.4 x10E3/uL (ref 1.4–7.0)
Neutrophils: 63 %
Platelets: 243 x10E3/uL (ref 150–450)
RBC: 5.26 x10E6/uL (ref 4.14–5.80)
RDW: 12.3 % (ref 11.6–15.4)
WBC: 7.1 x10E3/uL (ref 3.4–10.8)

## 2024-02-14 LAB — URIC ACID: Uric Acid: 8 mg/dL (ref 3.8–8.4)

## 2024-02-14 LAB — TSH: TSH: 3.35 u[IU]/mL (ref 0.450–4.500)

## 2024-02-14 LAB — PSA: Prostate Specific Ag, Serum: 0.7 ng/mL (ref 0.0–4.0)

## 2024-02-17 ENCOUNTER — Encounter: Admitting: Physician Assistant

## 2024-03-03 ENCOUNTER — Other Ambulatory Visit: Payer: Self-pay | Admitting: Podiatrist

## 2024-03-13 ENCOUNTER — Ambulatory Visit (INDEPENDENT_AMBULATORY_CARE_PROVIDER_SITE_OTHER)

## 2024-03-13 ENCOUNTER — Ambulatory Visit (INDEPENDENT_AMBULATORY_CARE_PROVIDER_SITE_OTHER): Admitting: Podiatry

## 2024-03-13 DIAGNOSIS — S9031XA Contusion of right foot, initial encounter: Secondary | ICD-10-CM

## 2024-03-13 DIAGNOSIS — M778 Other enthesopathies, not elsewhere classified: Secondary | ICD-10-CM

## 2024-03-13 MED ORDER — MELOXICAM 15 MG PO TABS: 15.0000 mg | ORAL_TABLET | Freq: Every day | ORAL | 0 refills | Status: AC

## 2024-03-13 NOTE — Progress Notes (Unsigned)
  Subjective:  Patient ID: Roy Lewis, male    DOB: Dec 13, 1970,  MRN: [id number]  Chief Complaint  Patient presents with   Foot Injury    53 y.o. male presents with the above complaint.  Patient presents with complaint of dorsal foot pain.  Patient states is painful to touch.  He states is noted swelling on top of his foot is causing some discomfort wanted to discuss treatment options for it.  He does have a history of gout.  Pain scale 7 out of 10 dull achy in nature.  He has cam boot at home.  He has not placed himself in it.   Review of Systems: Negative except as noted in the HPI. Denies N/V/F/Ch.  Past Medical History:  Diagnosis Date   DVT (deep venous thrombosis) (HCC)    Gout     Current Outpatient Medications:    meloxicam  (MOBIC ) 15 MG tablet, Take 1 tablet (15 mg total) by mouth daily., Disp: 30 tablet, Rfl: 0   colchicine  0.6 MG tablet, TAKE 1 TABLET (0.6 MG TOTAL) BY MOUTH DAILY. TAKE AS NEEDED FOR GOUT ATTACK, Disp: 30 tablet, Rfl: 2  Social History   Tobacco Use  Smoking Status Never  Smokeless Tobacco Never    No Known Allergies Objective:  There were no vitals filed for this visit. There is no height or weight on file to calculate BMI. Constitutional Well developed. Well nourished.  Vascular Dorsalis pedis pulses palpable bilaterally. Posterior tibial pulses palpable bilaterally. Capillary refill normal to all digits.  No cyanosis or clubbing noted. Pedal hair growth normal.  Neurologic Normal speech. Oriented to person, place, and time. Epicritic sensation to light touch grossly present bilaterally.  Dermatologic Nails well groomed and normal in appearance. No open wounds. No skin lesions.  Orthopedic: Pain on palpation to the right extensor tendon pain with resisted dorsiflexion of the toes no pain with resisted plantarflexion of the toes.  Swelling noted to the midfoot.  Mild midfoot arthritis noted.   Radiographs: None Assessment:   1.  Contusion of right foot, initial encounter   2. Extensor tendinitis of foot    Plan:  Patient was evaluated and treated and all questions answered.  Right extensor tendinitis foot - All questions and concerns were discussed with the patient in extensive detail given the amount of pain that he is experiencing benefit with cam boot immobilization for next 4 weeks.  Patient already has cam bed at home I encouraged replace his foot with a cam boot he states understanding.  No follow-ups on file.

## 2024-04-16 ENCOUNTER — Ambulatory Visit: Admitting: Podiatry
